# Patient Record
Sex: Male | Born: 1995 | Race: Black or African American | Hispanic: No | Marital: Single | Smoking: Current every day smoker
Health system: Southern US, Community
[De-identification: ages and names within clinical notes are randomized; demographics above are authoritative.]

## PROBLEM LIST (undated history)

## (undated) ENCOUNTER — Ambulatory Visit (HOSPITAL_COMMUNITY): Discharge status: Absent without leave | Payer: Self-pay

---
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
## 2016-03-19 ENCOUNTER — Encounter (HOSPITAL_COMMUNITY): Payer: Self-pay

## 2016-03-19 ENCOUNTER — Emergency Department (HOSPITAL_COMMUNITY)
Admission: EM | Admit: 2016-03-19 | Discharge: 2016-03-19 | Disposition: A | Discharge status: Home / Self Care | Payer: Worker's Compensation | Attending: Emergency Medicine | Admitting: Emergency Medicine

## 2016-03-19 DIAGNOSIS — Y939 Activity, unspecified: Secondary | ICD-10-CM | POA: Insufficient documentation

## 2016-03-19 DIAGNOSIS — Y929 Unspecified place or not applicable: Secondary | ICD-10-CM | POA: Insufficient documentation

## 2016-03-19 DIAGNOSIS — W268XXA Contact with other sharp object(s), not elsewhere classified, initial encounter: Secondary | ICD-10-CM | POA: Diagnosis not present

## 2016-03-19 DIAGNOSIS — S51811A Laceration without foreign body of right forearm, initial encounter: Secondary | ICD-10-CM | POA: Insufficient documentation

## 2016-03-19 DIAGNOSIS — Z23 Encounter for immunization: Secondary | ICD-10-CM | POA: Diagnosis not present

## 2016-03-19 DIAGNOSIS — Y990 Civilian activity done for income or pay: Secondary | ICD-10-CM | POA: Insufficient documentation

## 2016-03-19 DIAGNOSIS — IMO0002 Reserved for concepts with insufficient information to code with codable children: Secondary | ICD-10-CM

## 2016-03-19 MED ORDER — LIDOCAINE-EPINEPHRINE (PF) 2 %-1:200000 IJ SOLN
10.0000 mL | Freq: Once | INTRAMUSCULAR | Status: AC
  Administered 2016-03-19: 10 mL
  Filled 2016-03-19: qty 20

## 2016-03-19 MED ORDER — TETANUS-DIPHTH-ACELL PERTUSSIS 5-2.5-18.5 LF-MCG/0.5 IM SUSP
0.5000 mL | Freq: Once | INTRAMUSCULAR | Status: AC
  Administered 2016-03-19: 0.5 mL via INTRAMUSCULAR
  Filled 2016-03-19: qty 0.5

## 2016-03-19 NOTE — Discharge Instructions (Signed)
You will need to have your sutures removed in 7 days. You can have this done at urgent care or in this department or by your primary care provider. May wash wound with antibacterial soap and water. Otherwise keep clean and dry. Return to the ED if you experience redness or swelling around your wound, fevers or chills.

## 2016-03-19 NOTE — ED Triage Notes (Signed)
Pt states that he was at work and trying to piece off truck with another worker and cut his arm about 1 and a half inches. Bleeding controlled.

## 2016-03-19 NOTE — ED Provider Notes (Signed)
[facility]-EMERGENCY DEPT Provider Note   CSN: [id number] Arrival date & time: 03/19/16  0221     History   Chief Complaint Chief Complaint  Patient presents with  . Other    workers comp  . Laceration    HPI Jacob Hogan is a 20 y.o. male with no significant pmhx who presents to the ED today c/o laceration to right forearm. Pt states that he was at work unloading cardboard boxes for [employer] when a box fell on his right arm and caused a laceration. Laceration occurred around 5 hours PTA. Unknown last Tdap. No other trauma or injury.   HPI  History reviewed. No pertinent past medical history.  There are no active problems to display for this patient.   History reviewed. No pertinent surgical history.     Home Medications    Prior to Admission medications   Medication Sig Start Date End Date Taking? Authorizing Provider  amoxicillin (AMOXIL) 500 MG capsule Take 1 capsule (500 mg total) by mouth 3 (three) times daily. Patient not taking: Reported on 07/13/2014 02/20/14   Elpidio AnisShari Upstill, PA-C  clindamycin (CLEOCIN) 150 MG capsule Take 3 capsules (450 mg total) by mouth 3 (three) times daily. 01/16/15   Antony MaduraKelly Humes, PA-C  ibuprofen (ADVIL,MOTRIN) 600 MG tablet Take 1 tablet (600 mg total) by mouth every 6 (six) hours as needed. 02/20/14   Elpidio AnisShari Upstill, PA-C  ketorolac (TORADOL) 10 MG tablet Take 1 tablet (10 mg total) by mouth every 6 (six) hours as needed. 01/16/15   Antony MaduraKelly Humes, PA-C    Family History No family history on file.  Social History Social History  Substance Use Topics  . Smoking status: Never Smoker  . Smokeless tobacco: Never Used  . Alcohol use No     Allergies   Penicillins   Review of Systems Review of Systems  All other systems reviewed and are negative.    Physical Exam Updated Vital Signs BP 125/71   Pulse 94   Temp 98.8 F (37.1 C)   Resp 18   Ht 5\' 4"  (1.626 m)   Wt 76.2 kg   SpO2 100%   BMI 28.84 kg/m   Physical Exam    Constitutional: He is oriented to person, place, and time. He appears well-developed and well-nourished. No distress.  HENT:  Head: Normocephalic and atraumatic.  Eyes: Conjunctivae are normal. Right eye exhibits no discharge. Left eye exhibits no discharge. No scleral icterus.  Cardiovascular: Normal rate.   Pulmonary/Chest: Effort normal.  Neurological: He is alert and oriented to person, place, and time. Coordination normal.  Skin: Skin is warm and dry. No rash noted. He is not diaphoretic. No erythema. No pallor.  3 cm laceration to the volar aspect of right forearm. No foreign body seen or palpated. No surrounding erythema or edema.  Psychiatric: He has a normal mood and affect. His behavior is normal.  Nursing note and vitals reviewed.    ED Treatments / Results  Labs (all labs ordered are listed, but only abnormal results are displayed) Labs Reviewed - No data to display  EKG  EKG Interpretation None       Radiology No results found.  Procedures Procedures (including critical care time)  LACERATION REPAIR Performed by: Dub MikesSamantha Tripp Dowless Authorized by: Dub MikesSamantha Tripp Dowless Consent: Verbal consent obtained. Risks and benefits: risks, benefits and alternatives were discussed Consent given by: patient Patient identity confirmed: provided demographic data Prepped and Draped in normal sterile fashion Wound explored  Laceration Location: Right  forearm  Laceration Length: 3 cm  No Foreign Bodies seen or palpated  Anesthesia: local infiltration  Local anesthetic: lidocaine 2 % with epinephrine  Anesthetic total: 2 ml  Irrigation method: syringe Amount of cleaning: standard  Skin closure: Approximated   Number of sutures: 3   Technique: Simple interrupted   Patient tolerance: Patient tolerated the procedure well with no immediate complications.   Medications Ordered in ED Medications  Tdap (BOOSTRIX) injection 0.5 mL (not administered)   lidocaine-EPINEPHrine (XYLOCAINE W/EPI) 2 %-1:200000 (PF) injection 10 mL (not administered)     Initial Impression / Assessment and Plan / ED Course  I have reviewed the triage vital signs and the nursing notes.  Pertinent labs & imaging results that were available during my care of the patient were reviewed by me and considered in my medical decision making (see chart for details).  Clinical Course    Tdap booster given.Pressure irrigation performed. Laceration occurred < 8 hours prior to repair which was well tolerated. Pt has no co morbidities to effect normal wound healing. Discussed suture home care w pt and answered questions. Pt to f-u for wound check and suture removal in 7 days. Pt is hemodynamically stable w no complaints prior to dc.     Final Clinical Impressions(s) / ED Diagnoses   Final diagnoses:  Laceration    New Prescriptions New Prescriptions   No medications on file     Dub MikesSamantha Tripp Dowless, PA-C 03/19/16 [id number]    April Palumbo, MD 03/19/16 2307

## 2017-04-06 ENCOUNTER — Emergency Department (HOSPITAL_COMMUNITY)
Admission: EM | Admit: 2017-04-06 | Discharge: 2017-04-06 | Disposition: A | Discharge status: Home / Self Care | Payer: Self-pay | Attending: Emergency Medicine | Admitting: Emergency Medicine

## 2017-04-06 ENCOUNTER — Encounter (HOSPITAL_COMMUNITY): Payer: Self-pay | Admitting: Emergency Medicine

## 2017-04-06 ENCOUNTER — Emergency Department (HOSPITAL_COMMUNITY): Payer: Self-pay

## 2017-04-06 DIAGNOSIS — R1012 Left upper quadrant pain: Secondary | ICD-10-CM | POA: Insufficient documentation

## 2017-04-06 DIAGNOSIS — F17210 Nicotine dependence, cigarettes, uncomplicated: Secondary | ICD-10-CM | POA: Insufficient documentation

## 2017-04-06 LAB — CBC
HEMATOCRIT: 43 % (ref 39.0–52.0)
HEMOGLOBIN: 14.4 g/dL (ref 13.0–17.0)
MCH: 29.5 pg (ref 26.0–34.0)
MCHC: 33.5 g/dL (ref 30.0–36.0)
MCV: 88.1 fL (ref 78.0–100.0)
Platelets: 238 10*3/uL (ref 150–400)
RBC: 4.88 MIL/uL (ref 4.22–5.81)
RDW: 13.4 % (ref 11.5–15.5)
WBC: 10.5 10*3/uL (ref 4.0–10.5)

## 2017-04-06 LAB — COMPREHENSIVE METABOLIC PANEL
ALK PHOS: 54 U/L (ref 38–126)
ALT: 15 U/L — ABNORMAL LOW (ref 17–63)
ANION GAP: 8 (ref 5–15)
AST: 24 U/L (ref 15–41)
Albumin: 4.4 g/dL (ref 3.5–5.0)
BILIRUBIN TOTAL: 0.6 mg/dL (ref 0.3–1.2)
BUN: 6 mg/dL (ref 6–20)
CO2: 30 mmol/L (ref 22–32)
Calcium: 9.9 mg/dL (ref 8.9–10.3)
Chloride: 101 mmol/L (ref 101–111)
Creatinine, Ser: 1.15 mg/dL (ref 0.61–1.24)
GFR calc Af Amer: >60 mL/min (ref >60)
Glucose, Bld: 115 mg/dL — ABNORMAL HIGH (ref 65–99)
POTASSIUM: 4.2 mmol/L (ref 3.5–5.1)
Sodium: 139 mmol/L (ref 135–145)
TOTAL PROTEIN: 7.6 g/dL (ref 6.5–8.1)

## 2017-04-06 LAB — URINALYSIS, ROUTINE W REFLEX MICROSCOPIC
Bilirubin Urine: NEGATIVE
Glucose, UA: NEGATIVE mg/dL
Hgb urine dipstick: NEGATIVE
KETONES UR: NEGATIVE mg/dL
LEUKOCYTES UA: NEGATIVE
Nitrite: NEGATIVE
Protein, ur: NEGATIVE mg/dL
SPECIFIC GRAVITY, URINE: 1.014 (ref 1.005–1.030)
pH: 6 (ref 5.0–8.0)

## 2017-04-06 LAB — LIPASE, BLOOD: Lipase: 35 U/L (ref 11–51)

## 2017-04-06 LAB — I-STAT TROPONIN, ED: Troponin i, poc: 0.01 ng/mL (ref 0.00–0.08)

## 2017-04-06 MED ORDER — OMEPRAZOLE 20 MG PO CPDR: 20.0000 mg | DELAYED_RELEASE_CAPSULE | Freq: Every day | ORAL | 0 refills | Status: DC

## 2017-04-06 NOTE — Discharge Instructions (Signed)
As discussed, your evaluation today has been largely reassuring.  But, it is important that you monitor your condition carefully, and do not hesitate to return to the ED if you develop new, or concerning changes in your condition. ? ?Otherwise, please follow-up with your physician for appropriate ongoing care. ? ?

## 2017-04-06 NOTE — ED Provider Notes (Signed)
[facility] Provider Note   CSN: [id number] Arrival date & time: 04/06/17  0124     History   Chief Complaint Chief Complaint  Patient presents with  . Abdominal Pain    epigastric    HPI Jacob Hogan is a 21 y.o. male.  HPI  Patient presents with concern of episodic left upper quadrant abdominal pain. He notes that over the past 2 months he has had transient episodes, occurring without clear precipitant, and either resolving spontaneously or after he drank something. Last night, approximately 8 hours ago the patient had an episode of similar pain, burning, sharp, moderate in the left upper quadrant. The pain is nonradiating. Pain resolved after 45 minutes without any intervention. Since resolution he has had no additional complaints. Patient states that he is generally well, does smoke cigarettes, does not drink alcohol regularly.   History reviewed. No pertinent past medical history.  There are no active problems to display for this patient.   History reviewed. No pertinent surgical history.     Home Medications    Prior to Admission medications   Medication Sig Start Date End Date Taking? Authorizing Provider  omeprazole (PRILOSEC) 20 MG capsule Take 1 capsule (20 mg total) by mouth daily. Take one tablet daily 04/06/17   Gerhard Munch, MD    Family History No family history on file.  Social History Social History  Substance Use Topics  . Smoking status: Current Every Day Smoker    Packs/day: 0.25    Types: Cigarettes  . Smokeless tobacco: Never Used  . Alcohol use Yes     Comment: occ     Allergies   Penicillins   Review of Systems Review of Systems  Constitutional:       Per HPI, otherwise negative  HENT:       Per HPI, otherwise negative  Respiratory:       Per HPI, otherwise negative  Cardiovascular:       Per HPI, otherwise negative  Gastrointestinal: Negative for vomiting.  Endocrine:       Negative aside from HPI    Genitourinary:       Neg aside from HPI   Musculoskeletal:       Per HPI, otherwise negative  Skin: Negative.   Neurological: Negative for syncope.     Physical Exam Updated Vital Signs BP (!) 97/55   Pulse 67   Temp 97.6 F (36.4 C)   Resp 16   SpO2 99%   Physical Exam  Constitutional: He is oriented to person, place, and time. He appears well-developed. No distress.  HENT:  Head: Normocephalic and atraumatic.  Eyes: Conjunctivae and EOM are normal.  Cardiovascular: Normal rate and regular rhythm.   Pulmonary/Chest: Effort normal. No stridor. No respiratory distress.  Abdominal: He exhibits no distension.    Musculoskeletal: He exhibits no edema.  Neurological: He is alert and oriented to person, place, and time.  Skin: Skin is warm and dry.  Psychiatric: He has a normal mood and affect.  Nursing note and vitals reviewed.    ED Treatments / Results  Labs (all labs ordered are listed, but only abnormal results are displayed) Labs Reviewed  COMPREHENSIVE METABOLIC PANEL - Abnormal; Notable for the following:       Result Value   Glucose, Bld 115 (*)    ALT 15 (*)    All other components within normal limits  LIPASE, BLOOD  CBC  URINALYSIS, ROUTINE W REFLEX MICROSCOPIC  I-STAT TROPONIN, ED  EKG  EKG Interpretation  Date/Time:  Wednesday April 06 2017 01:34:21 EDT Ventricular Rate:  79 PR Interval:  140 QRS Duration: 76 QT Interval:  346 QTC Calculation: 396 R Axis:   34 Text Interpretation:  Normal sinus rhythm with sinus arrhythmia Artifact Borderline ECG Confirmed by Gerhard MunchLockwood, Jaye Polidori [phone]([id number]) on 04/06/2017 5:34:00 AM       Radiology Dg Chest 2 View  Result Date: 04/06/2017 CLINICAL DATA:  Upper abdominal pain for 1 day. EXAM: CHEST  2 VIEW COMPARISON:  None. FINDINGS: The cardiomediastinal contours are normal. The lungs are clear. Pulmonary vasculature is normal. No consolidation, pleural effusion, or pneumothorax. No acute osseous abnormalities  are seen. IMPRESSION: Normal radiographs of the chest. Electronically Signed   By: Rubye OaksMelanie  Ehinger M.D.   On: 04/06/2017 02:28    Procedures Procedures (including critical care time)  Medications Ordered in ED Medications - No data to display   Initial Impression / Assessment and Plan / ED Course  I have reviewed the triage vital signs and the nursing notes.  Pertinent labs & imaging results that were available during my care of the patient were reviewed by me and considered in my medical decision making (see chart for details).  Generally well-appearing young male presents with episodic left upper quadrant abdominal pain. Here he is awake, alert, with no ongoing symptoms. Patient's story suggests gastric etiology given the otherwise reassuring findings, absence of risk factors for other substantial processes including coronary disease. With no ongoing symptoms, and his description of pain, patient was started on a course of PPI. Patient will follow-up with gastroenterology.  Final Clinical Impressions(s) / ED Diagnoses   Final diagnoses:  Left upper quadrant pain    New Prescriptions New Prescriptions   OMEPRAZOLE (PRILOSEC) 20 MG CAPSULE    Take 1 capsule (20 mg total) by mouth daily. Take one tablet daily     Gerhard MunchLockwood, Marlane Hirschmann, MD 04/06/17 [phone]

## 2017-04-06 NOTE — ED Triage Notes (Signed)
Pt to ED c/o epigastric and LUQ abd pain x 2 months, intermittent. Patient states this episode started about 1.5 hours ago, was very intense at first and has calmed down some, but he wants to know what it is. Pt states, "it feels like something is trying to crawl its way out of my stomach and is pulling my stomach in." Pt denies N/V/D, no fevers or chills. Resp e/u, skin warm/dry.

## 2018-02-06 ENCOUNTER — Emergency Department (HOSPITAL_COMMUNITY)
Admission: EM | Admit: 2018-02-06 | Discharge: 2018-02-06 | Disposition: A | Discharge status: Home / Self Care | Payer: Self-pay | Attending: Emergency Medicine | Admitting: Emergency Medicine

## 2018-02-06 ENCOUNTER — Encounter (HOSPITAL_COMMUNITY): Payer: Self-pay | Admitting: Emergency Medicine

## 2018-02-06 ENCOUNTER — Emergency Department (HOSPITAL_COMMUNITY): Payer: Self-pay

## 2018-02-06 ENCOUNTER — Other Ambulatory Visit: Payer: Self-pay

## 2018-02-06 DIAGNOSIS — Y9372 Activity, wrestling: Secondary | ICD-10-CM | POA: Insufficient documentation

## 2018-02-06 DIAGNOSIS — S0990XA Unspecified injury of head, initial encounter: Secondary | ICD-10-CM

## 2018-02-06 DIAGNOSIS — F17210 Nicotine dependence, cigarettes, uncomplicated: Secondary | ICD-10-CM | POA: Insufficient documentation

## 2018-02-06 DIAGNOSIS — S161XXA Strain of muscle, fascia and tendon at neck level, initial encounter: Secondary | ICD-10-CM | POA: Insufficient documentation

## 2018-02-06 DIAGNOSIS — W51XXXA Accidental striking against or bumped into by another person, initial encounter: Secondary | ICD-10-CM | POA: Insufficient documentation

## 2018-02-06 DIAGNOSIS — Y999 Unspecified external cause status: Secondary | ICD-10-CM | POA: Insufficient documentation

## 2018-02-06 DIAGNOSIS — Y929 Unspecified place or not applicable: Secondary | ICD-10-CM | POA: Insufficient documentation

## 2018-02-06 DIAGNOSIS — S0101XA Laceration without foreign body of scalp, initial encounter: Secondary | ICD-10-CM | POA: Insufficient documentation

## 2018-02-06 MED ORDER — IBUPROFEN 800 MG PO TABS: 800.0000 mg | ORAL_TABLET | Freq: Three times a day (TID) | ORAL | 0 refills | Status: DC | PRN

## 2018-02-06 MED ORDER — FENTANYL CITRATE (PF) 100 MCG/2ML IJ SOLN
INTRAMUSCULAR | Status: AC
  Administered 2018-02-06: 100 ug
  Filled 2018-02-06: qty 2

## 2018-02-06 MED ORDER — TETANUS-DIPHTH-ACELL PERTUSSIS 5-2.5-18.5 LF-MCG/0.5 IM SUSP
0.5000 mL | Freq: Once | INTRAMUSCULAR | Status: DC
  Filled 2018-02-06: qty 0.5

## 2018-02-06 MED ORDER — LIDOCAINE-EPINEPHRINE 2 %-1:200000 IJ SOLN
10.0000 mL | Freq: Once | INTRAMUSCULAR | Status: AC
Start: 2018-02-06 — End: 2018-02-06
  Administered 2018-02-06: 10 mL
  Filled 2018-02-06: qty 20

## 2018-02-06 NOTE — ED Notes (Signed)
Patient transported to CT 

## 2018-02-06 NOTE — ED Notes (Signed)
Patient Alert and oriented to baseline. Stable and ambulatory to baseline. Patient verbalized understanding of the discharge instructions.  Patient belongings were taken by the patient.   
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
## 2018-02-06 NOTE — ED Notes (Signed)
ED Provider at bedside. 

## 2018-02-06 NOTE — ED Notes (Signed)
Patient returned from CT
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
## 2018-02-06 NOTE — Discharge Instructions (Signed)

## 2018-02-14 ENCOUNTER — Ambulatory Visit (HOSPITAL_COMMUNITY)
Admission: EM | Admit: 2018-02-14 | Discharge: 2018-02-14 | Disposition: A | Discharge status: Home / Self Care | Payer: Self-pay

## 2018-02-14 DIAGNOSIS — S0990XA Unspecified injury of head, initial encounter: Secondary | ICD-10-CM

## 2018-02-14 DIAGNOSIS — R51 Headache: Secondary | ICD-10-CM

## 2018-02-14 DIAGNOSIS — Z4802 Encounter for removal of sutures: Secondary | ICD-10-CM

## 2018-02-14 NOTE — ED Triage Notes (Signed)
Pt here for suture removal to posterior head.  Wound is clean and dry and approximated.  5 sutures were removed and pt tolerated it well.  Pt is still having some headache pain and was encouraged to limit his screen time and continue taking his Ibuprofen as prescribed.  Pt stated understanding.

## 2018-02-14 NOTE — ED Notes (Signed)
Bed: UC01 Expected date:  Expected time:  Means of arrival:  Comments: Appointments 

## 2020-03-07 ENCOUNTER — Encounter (HOSPITAL_COMMUNITY): Payer: Self-pay

## 2020-03-07 ENCOUNTER — Ambulatory Visit (HOSPITAL_COMMUNITY)
Admission: EM | Admit: 2020-03-07 | Discharge: 2020-03-07 | Disposition: A | Discharge status: Home / Self Care | Payer: Self-pay | Attending: Family Medicine | Admitting: Family Medicine

## 2020-03-07 ENCOUNTER — Other Ambulatory Visit: Payer: Self-pay

## 2020-03-07 ENCOUNTER — Ambulatory Visit (INDEPENDENT_AMBULATORY_CARE_PROVIDER_SITE_OTHER): Payer: Self-pay

## 2020-03-07 DIAGNOSIS — M79642 Pain in left hand: Secondary | ICD-10-CM

## 2020-03-07 DIAGNOSIS — S62339A Displaced fracture of neck of unspecified metacarpal bone, initial encounter for closed fracture: Secondary | ICD-10-CM

## 2020-03-07 MED ORDER — HYDROCODONE-ACETAMINOPHEN 5-325 MG PO TABS: 1.0000 | ORAL_TABLET | Freq: Four times a day (QID) | ORAL | 0 refills | Status: DC | PRN

## 2020-03-07 NOTE — Progress Notes (Signed)
Orthopedic Tech Progress Note Patient Details:  Jacob Hogan 04-Oct-1995 [id number]  Ortho Devices Type of Ortho Device: Ulna gutter splint Ortho Device/Splint Location: URE Ortho Device/Splint Interventions: Ordered, Application   Post Interventions Patient Tolerated: Well   [name] [name] 03/07/2020, 7:23 PM
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
## 2020-03-07 NOTE — Discharge Instructions (Addendum)
  No use of hand  leave splint on Use ice and elevation to reduce reduce swelling Take ibuprofen for moderate pain Take pain medication as needed for severe pain.  Do not drive on pain medicine Call Dr. Donnie Mesa office Monday morning for an appointment next week
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
## 2020-04-02 ENCOUNTER — Ambulatory Visit (HOSPITAL_COMMUNITY)
Admission: EM | Admit: 2020-04-02 | Discharge: 2020-04-02 | Disposition: A | Discharge status: Home / Self Care | Payer: HRSA Program | Attending: Family Medicine | Admitting: Family Medicine

## 2020-04-02 ENCOUNTER — Other Ambulatory Visit: Payer: Self-pay

## 2020-04-02 DIAGNOSIS — Z1152 Encounter for screening for COVID-19: Secondary | ICD-10-CM

## 2020-04-02 DIAGNOSIS — Z20822 Contact with and (suspected) exposure to covid-19: Secondary | ICD-10-CM | POA: Diagnosis not present

## 2020-04-02 IMAGING — CT CT CERVICAL SPINE W/O CM
5 of 8 series · 12 of 33 positions shown, 13 images · non-contrast
Comparison: None.

CLINICAL DATA: 21-year-old male with history of trauma from injury
to head on kitchen floor. Laceration in the acceptable region of the
head.

EXAM:
CT HEAD WITHOUT CONTRAST
CT CERVICAL SPINE WITHOUT CONTRAST
TECHNIQUE: Multidetector CT imaging of the head and cervical spine was
performed following the standard protocol without intravenous
contrast. Multiplanar CT image reconstructions of the cervical spine
were also generated.

[Series 5: head bone · axial · 0.46mm/px · z∈[+1172,+1228]mm · 2 of 85 slices shown]
[im 29/85  bone]
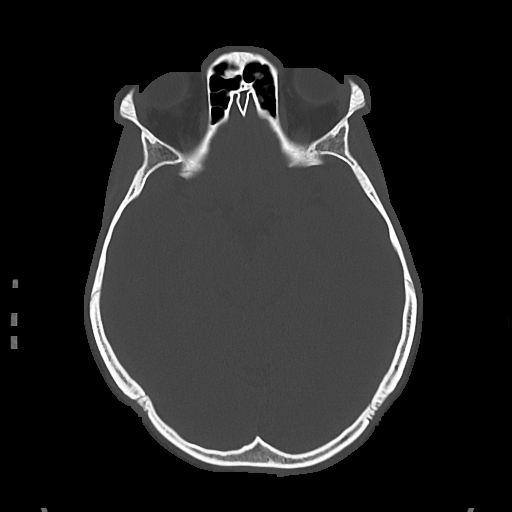
[im 57/85  bone]
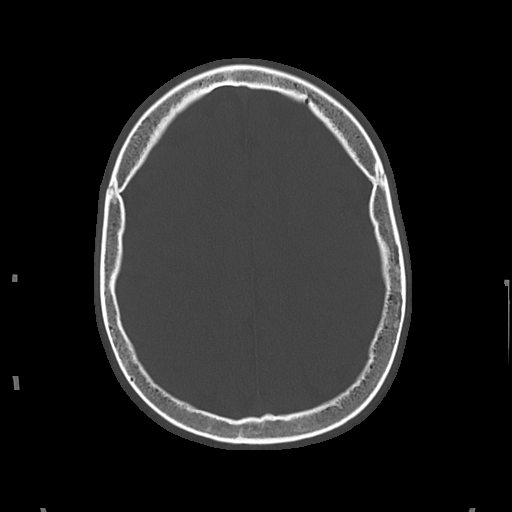

[Series 9: c spine soft · axial · 0.27mm/px · z∈[+1047,+1103]mm · 2 of 86 slices shown]
[im 29/86  soft-tissue]
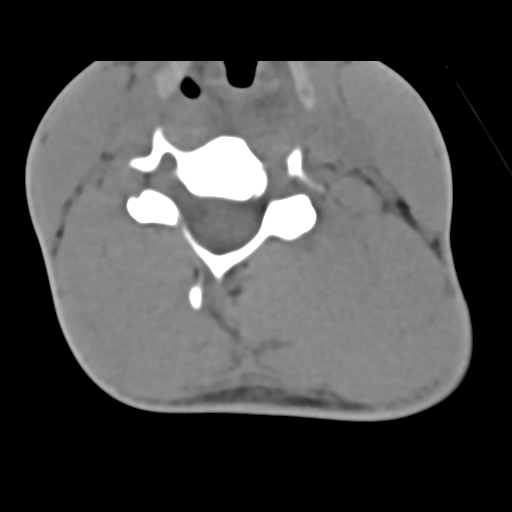
[im 57/86  soft-tissue]
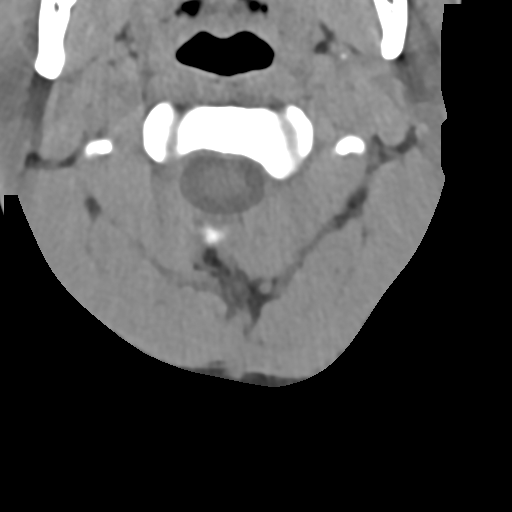

[Series 10: orthogonal axials · axial · 0.21mm/px · z∈[+1022,+1080]mm · 2 of 80 slices shown, 3 images]
[im 27/80  soft-tissue]
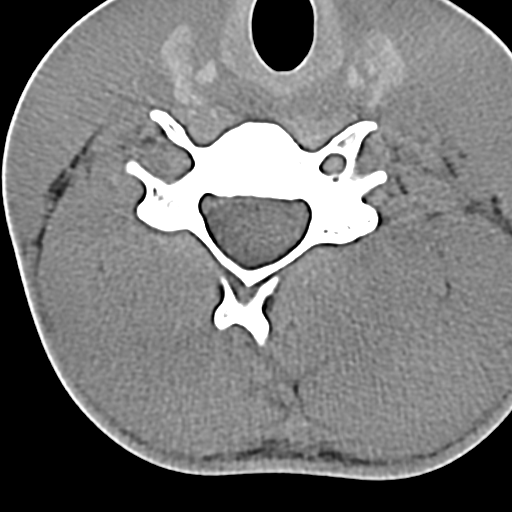
[im 27/80  bone]
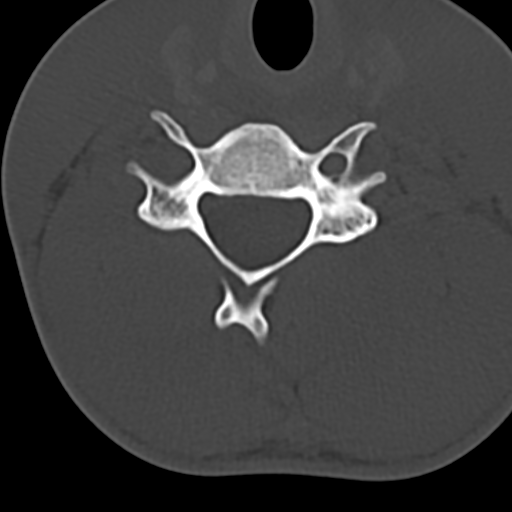
[im 53/80  bone]
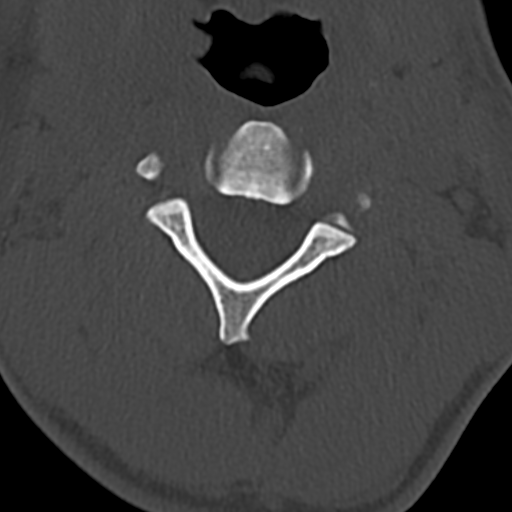

[Series 11: sag bone · sagittal · 0.27mm/px · 5 of 61 slices shown]
[im 11/61  bone]
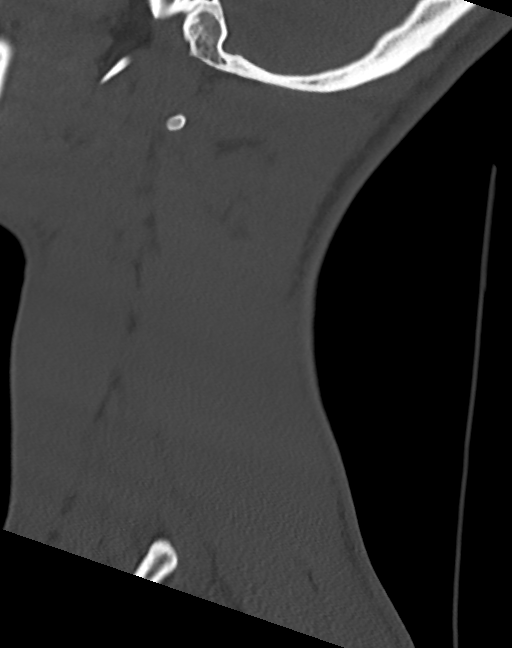
[im 21/61  bone]
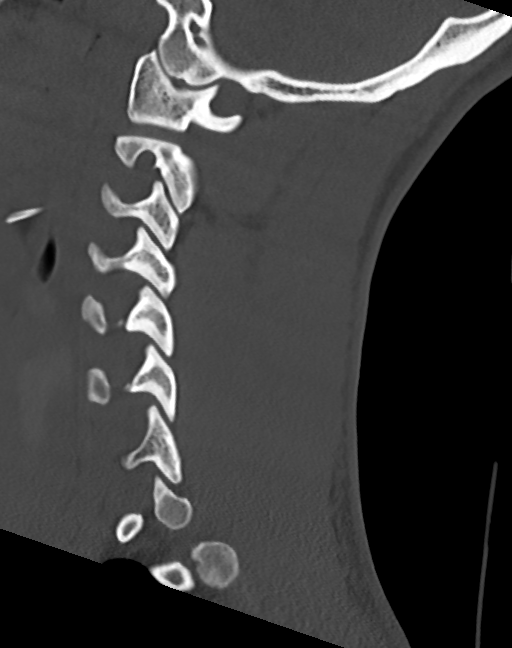
[im 31/61  bone]
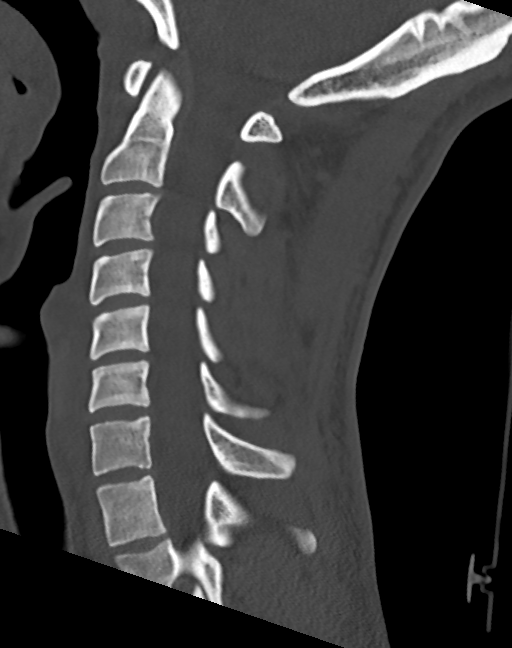
[im 41/61  bone]
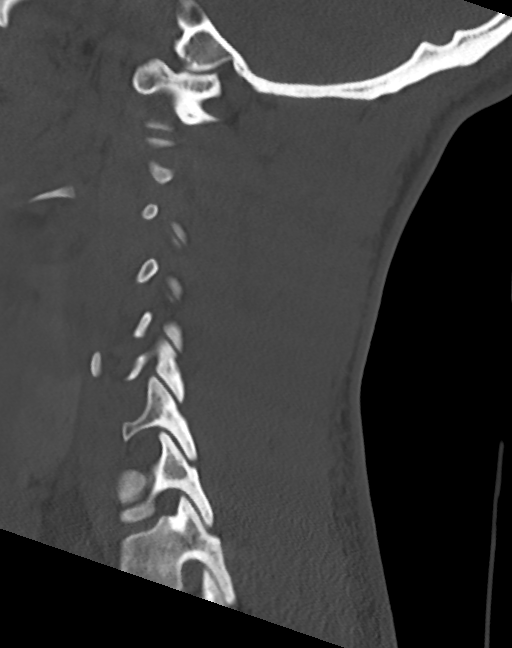
[im 51/61  bone]
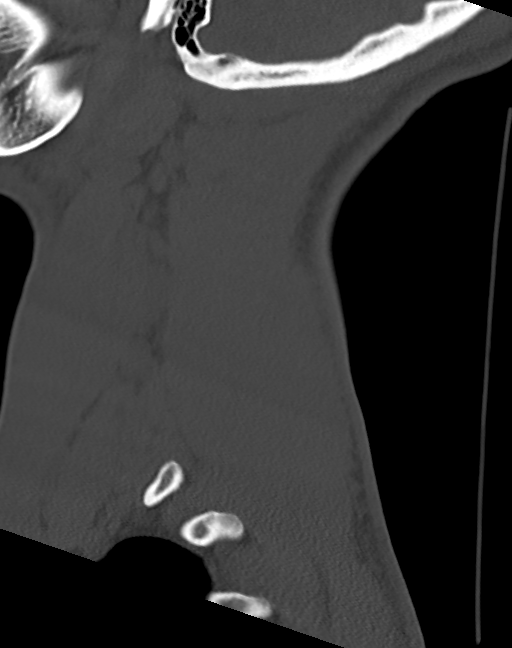

[Series 12: cor bone · coronal · 0.23mm/px · 1 of 69 slices shown]
[im 35/69  bone]
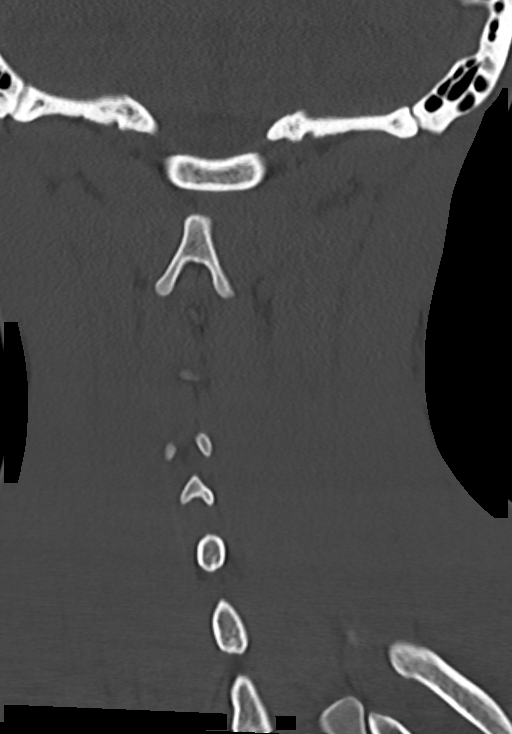

[12 of 33 positions shown; findings below may reference images not displayed]

FINDINGS: CT HEAD FINDINGS

Brain: No evidence of acute infarction, hemorrhage, hydrocephalus,
extra-axial collection or mass lesion/mass effect.

Vascular: No hyperdense vessel or unexpected calcification.

Skull: Normal. Negative for fracture or focal lesion.

Sinuses/Orbits: Small mucosal retention cyst or polyp in the right
maxillary sinus incidentally noted. No acute finding.

Other: None.

CT CERVICAL SPINE FINDINGS

Alignment: Normal.

Skull base and vertebrae: No acute fracture. No primary bone lesion
or focal pathologic process.

Soft tissues and spinal canal: No prevertebral fluid or swelling. No
visible canal hematoma.

Disc levels:  No significant degenerative disc disease.

Upper chest: Unremarkable.

Other: None.
IMPRESSION: 1. No evidence of significant acute traumatic injury to the skull,
brain or cervical spine.
2. The appearance of the brain is normal.

## 2020-04-02 NOTE — Discharge Instructions (Signed)

## 2020-04-02 NOTE — ED Triage Notes (Signed)
Pt presents for COVID test after being exposed two days ago. Denies any symptoms at this time.

## 2020-04-03 LAB — SARS CORONAVIRUS 2 (TAT 6-24 HRS): SARS Coronavirus 2: NEGATIVE

## 2020-11-23 ENCOUNTER — Emergency Department (HOSPITAL_COMMUNITY)
Admission: EM | Admit: 2020-11-23 | Discharge: 2020-11-24 | Disposition: A | Discharge status: Home / Self Care | Payer: BC Managed Care – PPO | Attending: Emergency Medicine | Admitting: Emergency Medicine

## 2020-11-23 DIAGNOSIS — R609 Edema, unspecified: Secondary | ICD-10-CM

## 2020-11-23 DIAGNOSIS — M7989 Other specified soft tissue disorders: Secondary | ICD-10-CM | POA: Diagnosis present

## 2020-11-23 DIAGNOSIS — F17210 Nicotine dependence, cigarettes, uncomplicated: Secondary | ICD-10-CM | POA: Diagnosis not present

## 2020-11-23 DIAGNOSIS — R600 Localized edema: Secondary | ICD-10-CM | POA: Diagnosis not present

## 2020-11-24 ENCOUNTER — Encounter (HOSPITAL_COMMUNITY): Payer: Self-pay | Admitting: Emergency Medicine

## 2020-11-24 ENCOUNTER — Other Ambulatory Visit: Payer: Self-pay

## 2020-11-24 LAB — I-STAT CHEM 8, ED
BUN: 32 mg/dL — ABNORMAL HIGH (ref 6–20)
Calcium, Ion: 1.14 mmol/L — ABNORMAL LOW (ref 1.15–1.40)
Chloride: 101 mmol/L (ref 98–111)
Creatinine, Ser: 1.3 mg/dL — ABNORMAL HIGH (ref 0.61–1.24)
Glucose, Bld: 131 mg/dL — ABNORMAL HIGH (ref 70–99)
HCT: 40 % (ref 39.0–52.0)
Hemoglobin: 13.6 g/dL (ref 13.0–17.0)
Potassium: 4 mmol/L (ref 3.5–5.1)
Sodium: 140 mmol/L (ref 135–145)
TCO2: 28 mmol/L (ref 22–32)

## 2020-11-24 NOTE — ED Provider Notes (Signed)
[facility]-EMERGENCY DEPT Provider Note   CSN: [id number] Arrival date & time: 11/23/20  2340     History Chief Complaint  Patient presents with  . Foot Swelling    Amalio Loe is a 25 y.o. male.  The history is provided by the patient.  Illness Location:  Feet  Quality:  Swellling Severity:  Mild Onset quality:  Gradual Timing:  Constant Progression:  Unchanged Chronicity:  New Context:  Walking a lot in crocs Relieved by:  Nothing  Worsened by:  Nothing  Ineffective treatments:  Crocs  Associated symptoms: no abdominal pain, no chest pain, no congestion, no cough, no diarrhea, no ear pain, no fatigue, no fever, no headaches, no loss of consciousness, no myalgias, no nausea, no rash, no rhinorrhea, no shortness of breath, no sore throat, no vomiting and no wheezing        History reviewed. No pertinent past medical history.  There are no problems to display for this patient.   History reviewed. No pertinent surgical history.     Family History  Problem Relation Age of Onset  . Healthy Mother   . Healthy Father     Social History   Tobacco Use  . Smoking status: Current Every Day Smoker    Packs/day: 0.25    Types: Cigarettes  . Smokeless tobacco: Never Used  Substance Use Topics  . Alcohol use: Yes    Comment: occ  . Drug use: Yes    Types: Marijuana    Home Medications Prior to Admission medications   Medication Sig Start Date End Date Taking? Authorizing Provider  HYDROcodone-acetaminophen (NORCO/VICODIN) 5-325 MG tablet Take 1-2 tablets by mouth every 6 (six) hours as needed. Patient not taking: Reported on 11/24/2020 03/07/20   Eustace Moore, MD  ibuprofen (ADVIL,MOTRIN) 800 MG tablet Take 1 tablet (800 mg total) by mouth every 8 (eight) hours as needed. Patient not taking: No sig reported 02/06/18   Long, Arlyss Repress, MD  omeprazole (PRILOSEC) 20 MG capsule Take 1 capsule (20 mg total) by mouth daily. Take one tablet  daily Patient not taking: No sig reported 04/06/17   Gerhard Munch, MD    Allergies    Penicillins and Apple  Review of Systems   Review of Systems  Constitutional: Negative for fatigue and fever.  HENT: Negative for congestion, ear pain, rhinorrhea and sore throat.   Eyes: Negative for visual disturbance.  Respiratory: Negative for cough, shortness of breath and wheezing.   Cardiovascular: Negative for chest pain.  Gastrointestinal: Negative for abdominal pain, diarrhea, nausea and vomiting.  Genitourinary: Negative for difficulty urinating.  Musculoskeletal: Negative for myalgias.  Skin: Negative for rash.  Neurological: Negative for loss of consciousness and headaches.  Psychiatric/Behavioral: Negative for agitation.  All other systems reviewed and are negative.   Physical Exam Updated Vital Signs BP 107/65   Pulse 67   Temp 98.8 F (37.1 C) (Oral)   Resp 18   SpO2 98%   Physical Exam Vitals and nursing note reviewed.  Constitutional:      General: He is not in acute distress.    Appearance: Normal appearance.  HENT:     Head: Normocephalic and atraumatic.     Nose: Nose normal.  Eyes:     Conjunctiva/sclera: Conjunctivae normal.     Pupils: Pupils are equal, round, and reactive to light.  Cardiovascular:     Rate and Rhythm: Normal rate and regular rhythm.     Pulses: Normal pulses.  Heart sounds: Normal heart sounds.  Pulmonary:     Effort: Pulmonary effort is normal.     Breath sounds: Normal breath sounds.  Abdominal:     General: Abdomen is flat. Bowel sounds are normal.     Palpations: Abdomen is soft.     Tenderness: There is no abdominal tenderness. There is no guarding.  Musculoskeletal:        General: Normal range of motion.     Cervical back: Normal range of motion and neck supple.     Comments: Trace non pitting edema of  The feet   Skin:    General: Skin is warm and dry.     Capillary Refill: Capillary refill takes less than 2 seconds.   Neurological:     General: No focal deficit present.     Mental Status: He is alert and oriented to person, place, and time.     Deep Tendon Reflexes: Reflexes normal.  Psychiatric:        Mood and Affect: Mood normal.        Behavior: Behavior normal.     ED Results / Procedures / Treatments   Labs (all labs ordered are listed, but only abnormal results are displayed) Labs Reviewed - No data to display  EKG None  Radiology No results found.  Procedures Procedures   Medications Ordered in ED Medications - No data to display  ED Course  I have reviewed the triage vital signs and the nursing notes.  Pertinent labs & imaging results that were available during my care of the patient were reviewed by me and considered in my medical decision making (see chart for details).   Dependent edema.  No croc wearing ice and elevate feet and wear compression stockings and lace up shoes.     Unknown Schleyer was evaluated in Emergency Department on 11/24/2020 for the symptoms described in the history of present illness. He was evaluated in the context of the global COVID-19 pandemic, which necessitated consideration that the patient might be at risk for infection with the SARS-CoV-2 virus that causes COVID-19. Institutional protocols and algorithms that pertain to the evaluation of patients at risk for COVID-19 are in a state of rapid change based on information released by regulatory bodies including the CDC and federal and state organizations. These policies and algorithms were followed during the patient's care in the ED.  Final Clinical Impression(s) / ED Diagnoses Final diagnoses:  Dependent edema   Return for intractable cough, coughing up blood, fevers >100.4 unrelieved by medication, shortness of breath, intractable vomiting, chest pain, shortness of breath, weakness, numbness, changes in speech, facial asymmetry, abdominal pain, passing out, Inability to tolerate liquids or food,  cough, altered mental status or any concerns. No signs of systemic illness or infection. The patient is nontoxic-appearing on exam and vital signs are within normal limits.  I have reviewed the triage vital signs and the nursing notes. Pertinent labs & imaging results that were available during my care of the patient were reviewed by me and considered in my medical decision making (see chart for details). After history, exam, and medical workup I feel the patient has been appropriately medically screened and is safe for discharge home. Pertinent diagnoses were discussed with the patient. Patient was given return precautions. Rx / DC Orders ED Discharge Orders         Ordered    Compression stockings       Comments: Use as directed   11/24/20 0135  Mckinze Poirier, MD 11/24/20 2481

## 2020-11-24 NOTE — ED Triage Notes (Addendum)
Patient arrives complaining of bilateral feet swelling and a sudden gain in weight over the last 2-3 weeks. Patient states that he has gained about 12-15 pounds. Patient noted to have pitting edema to his feet. Patient states he works from home and sits for long periods of time. Patient also requesting evaluation for foreign body removal from his left leg.

## 2022-05-02 IMAGING — DX DG HAND COMPLETE 3+V*L*
3 series · 3 of 3 positions shown · non-contrast
Comparison: None.

CLINICAL DATA: Pain

EXAM:
LEFT HAND - COMPLETE 3+ VIEW

[hand pa]
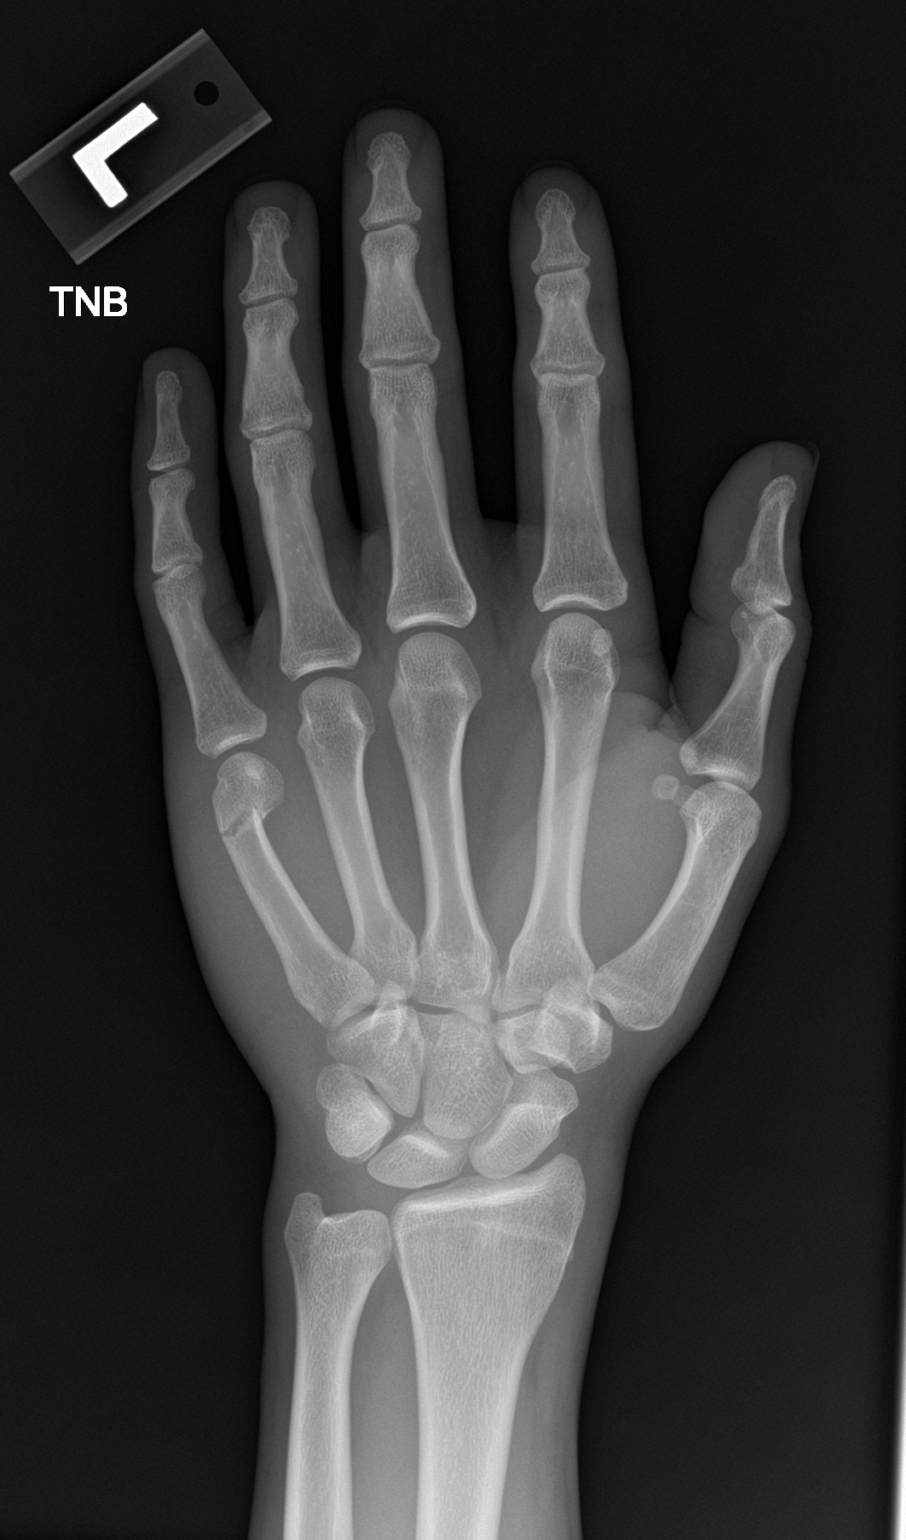

[hand obl]
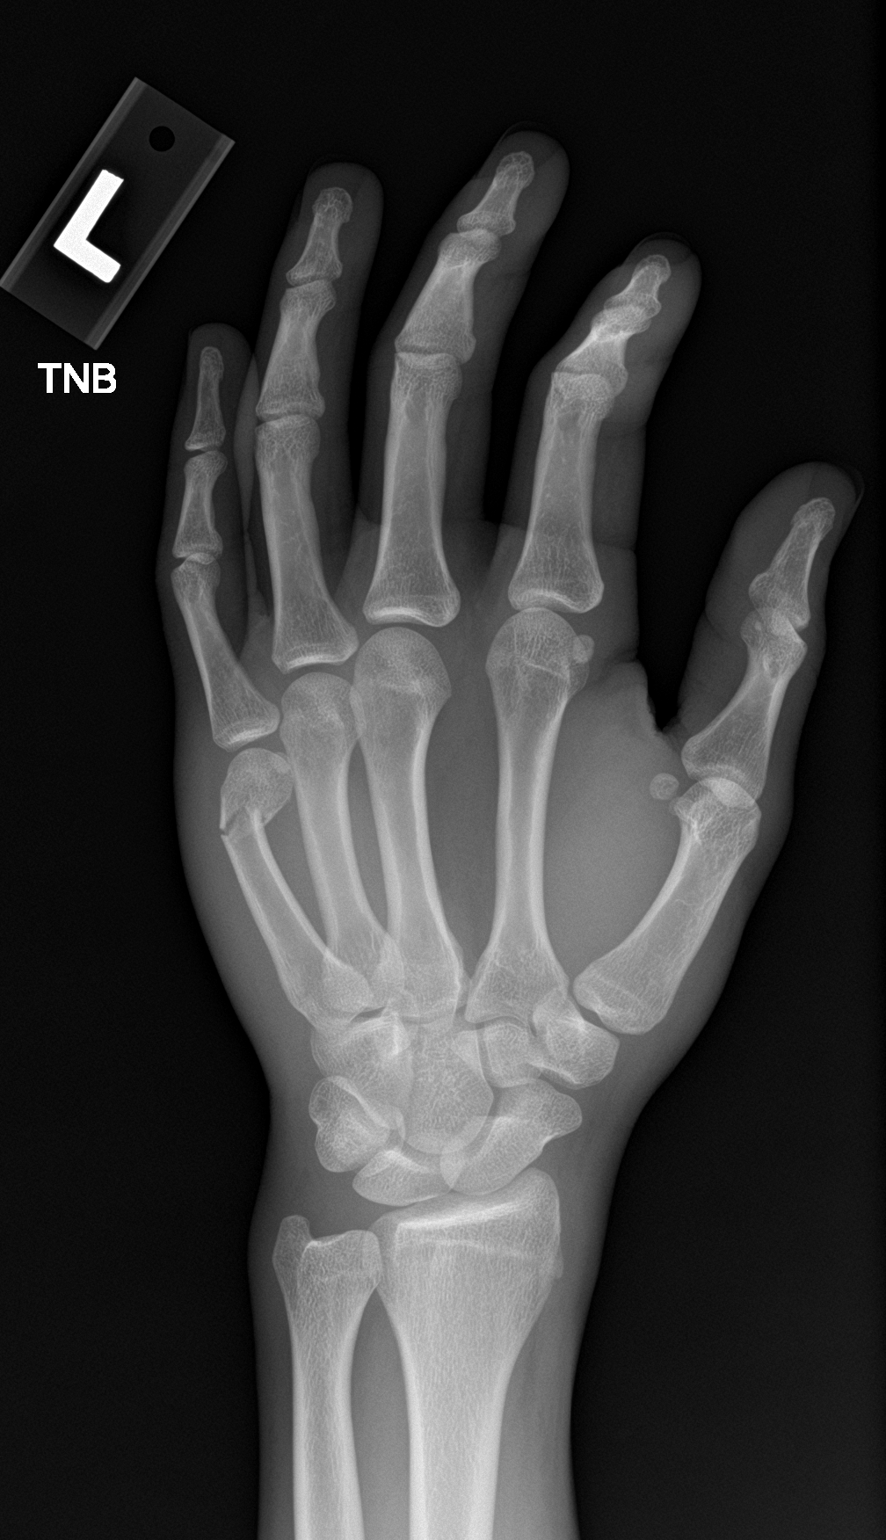

[hand lat]
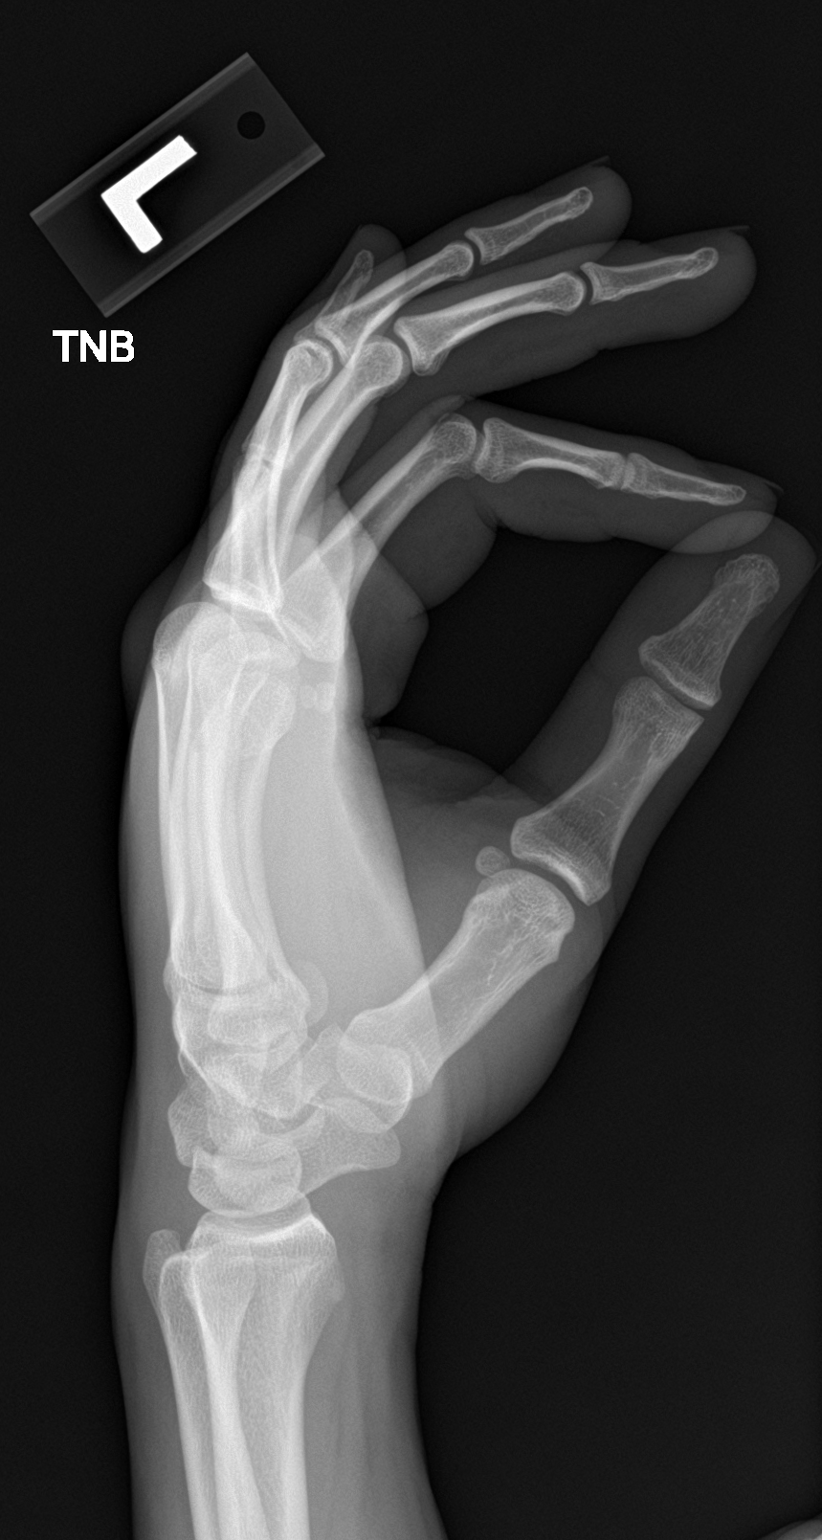

[3 of 3 positions shown; findings below may reference images not displayed]

FINDINGS: There is an acute displaced, angulated fracture through the head of
the fifth metacarpal. There is surrounding soft tissue swelling.
There is no frank dislocation.
IMPRESSION: Acute displaced, angulated fracture through the head of the fifth
metacarpal with surrounding soft tissue swelling.

## 2023-06-24 ENCOUNTER — Emergency Department (HOSPITAL_COMMUNITY)
Admission: EM | Admit: 2023-06-24 | Discharge: 2023-06-24 | Disposition: A | Discharge status: Home / Self Care | Payer: Self-pay | Attending: Emergency Medicine | Admitting: Emergency Medicine

## 2023-06-24 ENCOUNTER — Other Ambulatory Visit: Payer: Self-pay

## 2023-06-24 ENCOUNTER — Encounter (HOSPITAL_COMMUNITY): Payer: Self-pay

## 2023-06-24 DIAGNOSIS — K0889 Other specified disorders of teeth and supporting structures: Secondary | ICD-10-CM

## 2023-06-24 DIAGNOSIS — K029 Dental caries, unspecified: Secondary | ICD-10-CM | POA: Insufficient documentation

## 2023-06-24 MED ORDER — OXYCODONE-ACETAMINOPHEN 5-325 MG PO TABS: 1.0000 | ORAL_TABLET | Freq: Four times a day (QID) | ORAL | 0 refills | Status: AC | PRN

## 2023-06-24 MED ORDER — CLINDAMYCIN HCL 300 MG PO CAPS: 300.0000 mg | ORAL_CAPSULE | Freq: Two times a day (BID) | ORAL | 0 refills | Status: AC

## 2023-06-24 MED ORDER — CLINDAMYCIN HCL 300 MG PO CAPS
300.0000 mg | ORAL_CAPSULE | Freq: Once | ORAL | Status: AC
  Administered 2023-06-24: 300 mg via ORAL
  Filled 2023-06-24: qty 1

## 2023-06-24 MED ORDER — NAPROXEN 500 MG PO TABS: 500.0000 mg | ORAL_TABLET | Freq: Two times a day (BID) | ORAL | 0 refills | Status: AC

## 2023-06-24 NOTE — ED Triage Notes (Signed)
Pt states that he has a dental abscess x 3 days to his right upper tooth. Pt reports swelling and nerve pain in that tooth.

## 2023-06-24 NOTE — Discharge Instructions (Addendum)
Follow-up with your dentist  Return for new or worsening symptoms

## 2023-06-24 NOTE — ED Provider Notes (Signed)
[facility] EMERGENCY DEPARTMENT AT [facility] Provider Note   CSN: [id number] Arrival date & time: 06/24/23  2051    History  Chief Complaint  Patient presents with   Dental Problem    Jacob Hogan is a 27 y.o. male no past medical history here for evaluation of dental pain.  Pain to right upper dentition over the last 3 to 4 days.  Taking OTC medications without relief.  Some mild facial swelling, no redness, warmth.  Able to eat and drink at home.  No headache, fever, neck pain, neck stiffness, difficulty tolerating p.o. intake.  Has appointment for dentistry on Tuesday.  He has no dental caries and cracked teeth  HPI     Home Medications Prior to Admission medications   Medication Sig Start Date End Date Taking? Authorizing Provider  clindamycin (CLEOCIN) 300 MG capsule Take 1 capsule (300 mg total) by mouth 2 (two) times daily for 5 days. 06/24/23 06/29/23 Yes Jalaysia Lobb A, PA-C  naproxen (NAPROSYN) 500 MG tablet Take 1 tablet (500 mg total) by mouth 2 (two) times daily. 06/24/23  Yes Sallyann Kinnaird A, PA-C  oxyCODONE-acetaminophen (PERCOCET/ROXICET) 5-325 MG tablet Take 1 tablet by mouth every 6 (six) hours as needed for severe pain (pain score 7-10). 06/24/23  Yes Lucca Greggs A, PA-C      Allergies    Penicillins and Apple juice    Review of Systems   Review of Systems  Constitutional: Negative.   HENT:  Positive for dental problem and facial swelling. Negative for postnasal drip, rhinorrhea, sinus pressure, sinus pain, sneezing, sore throat, tinnitus, trouble swallowing and voice change.   Respiratory: Negative.  Negative for shortness of breath.   Cardiovascular: Negative.   Gastrointestinal:  Negative for nausea and vomiting.  Genitourinary: Negative.   Musculoskeletal:  Negative for neck pain and neck stiffness.  Skin: Negative.   Neurological: Negative.   All other systems reviewed and are negative.   Physical Exam Updated Vital  Signs BP 114/71 (BP Location: Left Arm)   Pulse 77   Temp 98.3 F (36.8 C) (Oral)   Resp 14   Ht 5\' 4"  (1.626 m)   Wt 61.2 kg   SpO2 100%   BMI 23.16 kg/m  Physical Exam Vitals and nursing note reviewed.  Constitutional:      General: He is not in acute distress.    Appearance: He is well-developed. He is not ill-appearing, toxic-appearing or diaphoretic.  HENT:     Head: Normocephalic and atraumatic. No laceration.     Jaw: There is normal jaw occlusion.      Comments: Minimal right maxillary swelling. No redness, warmth, fluctuance, induration.  No drooling, dysphagia, trismus.  No submandibular swelling.    Mouth/Throat:     Lips: Pink.     Mouth: Mucous membranes are moist.     Dentition: Abnormal dentition. Does not have dentures. Dental tenderness and dental caries present. No gingival swelling, dental abscesses or gum lesions.     Pharynx: Oropharynx is clear. Uvula midline.      Comments: Sublingual area soft, tongue midline, no pooling of secretions.  Gingival erythema and tenderness right upper dentition, multiple dental caries, cracked teeth Eyes:     Pupils: Pupils are equal, round, and reactive to light.  Neck:     Trachea: Trachea and phonation normal.     Comments: No neck stiffness or neck rigidity Cardiovascular:     Rate and Rhythm: Normal rate and regular rhythm.  Pulmonary:  Effort: Pulmonary effort is normal. No respiratory distress.  Abdominal:     General: There is no distension.     Palpations: Abdomen is soft.  Musculoskeletal:        General: Normal range of motion.     Cervical back: Full passive range of motion without pain, normal range of motion and neck supple.  Skin:    General: Skin is warm and dry.  Neurological:     General: No focal deficit present.     Mental Status: He is alert and oriented to person, place, and time.     ED Results / Procedures / Treatments   Labs (all labs ordered are listed, but only abnormal results are  displayed) Labs Reviewed - No data to display  EKG None  Radiology No results found.  Procedures Procedures    Medications Ordered in ED Medications  clindamycin (CLEOCIN) capsule 300 mg (300 mg Oral Given 06/24/23 2302)    ED Course/ Medical Decision Making/ A&P   27 year old here for evaluation of dental pain.  He is afebrile, nonseptic, not ill-appearing.  He is tolerating his secretions.  He has no voice changes, no drooling, dysphagia or trismus.  Sublingual area soft.  Multiple dental caries, gingival erythema and tenderness to right upper dentition.  Low suspicion for Ludwig's angina, deep space infection.  He is tolerating p.o. intake here.  Do not feel he needs labs or imaging.  He already has appointment for dentistry on Tuesday, 4 days from today will have him follow-up then, will start on antibiotics.  He is allergic to penicillins.  Clindamycin given here.  The patient has been appropriately medically screened and/or stabilized in the ED. I have low suspicion for any other emergent medical condition which would require further screening, evaluation or treatment in the ED or require inpatient management.  Patient is hemodynamically stable and in no acute distress.  Patient able to ambulate in department prior to ED.  Evaluation does not show acute pathology that would require ongoing or additional emergent interventions while in the emergency department or further inpatient treatment.  I have discussed the diagnosis with the patient and answered all questions.  Pain is been managed while in the emergency department and patient has no further complaints prior to discharge.  Patient is comfortable with plan discussed in room and is stable for discharge at this time.  I have discussed strict return precautions for returning to the emergency department.  Patient was encouraged to follow-up with PCP/specialist refer to at discharge.                                 Medical Decision  Making Amount and/or Complexity of Data Reviewed External Data Reviewed: labs, radiology and notes.  Risk OTC drugs. Prescription drug management. Decision regarding hospitalization. Diagnosis or treatment significantly limited by social determinants of health.          Final Clinical Impression(s) / ED Diagnoses Final diagnoses:  Pain, dental    Rx / DC Orders ED Discharge Orders          Ordered    clindamycin (CLEOCIN) 300 MG capsule  2 times daily        06/24/23 2222    oxyCODONE-acetaminophen (PERCOCET/ROXICET) 5-325 MG tablet  Every 6 hours PRN        06/24/23 2222    naproxen (NAPROSYN) 500 MG tablet  2 times daily  06/24/23 2222              Derrico Zhong A, PA-C 06/24/23 2310    Lorre Nick, MD 06/28/23 [phone]

## 2023-08-10 ENCOUNTER — Emergency Department (HOSPITAL_COMMUNITY)
Admission: EM | Admit: 2023-08-10 | Discharge: 2023-08-11 | Disposition: A | Discharge status: Home / Self Care | Payer: Self-pay | Attending: Emergency Medicine | Admitting: Emergency Medicine

## 2023-08-10 ENCOUNTER — Encounter (HOSPITAL_COMMUNITY): Payer: Self-pay

## 2023-08-10 ENCOUNTER — Other Ambulatory Visit: Payer: Self-pay

## 2023-08-10 DIAGNOSIS — K047 Periapical abscess without sinus: Secondary | ICD-10-CM | POA: Insufficient documentation

## 2023-08-10 NOTE — ED Triage Notes (Signed)
 Right upper dental pain that started last night, pt states he has had a recurrent abscess for 3-4 years in the same area. Swelling noted to face. Last dose of clindamycin 2 months ago.

## 2023-08-11 MED ORDER — NAPROXEN 500 MG PO TABS
500.0000 mg | ORAL_TABLET | Freq: Once | ORAL | Status: AC
  Administered 2023-08-11: 500 mg via ORAL
  Filled 2023-08-11: qty 1

## 2023-08-11 MED ORDER — CLINDAMYCIN HCL 150 MG PO CAPS: 150.0000 mg | ORAL_CAPSULE | Freq: Four times a day (QID) | ORAL | 0 refills | Status: AC

## 2023-08-11 NOTE — Discharge Instructions (Signed)
 You were seen today for ongoing concern for abdominal infection.  Take antibiotic as prescribed.  You may take Tylenol and ibuprofen for pain.  See dental resources provided.

## 2023-08-11 NOTE — ED Provider Notes (Signed)
 [place] [facility] DE[facility]MENT AT [facility] Provider Note   CSN: [id number] Arrival date & time: 08/10/23  2054     History  Chief Complaint  Patient presents with   Dental Problem    Jacob Hogan is a 28 y.o. male.  HPI     This is a 28 year old male who presents with recurrent dental infection.  Patient reports that he has had a recurrent dental infection and abscess in the right upper gumline.  He saw dentist and was told that he would need to spend over $3000 for dental work.  He states he does not have this money.  He last saw dentist approximately 2 months ago when he finished a course of clindamycin .  Over the last 24 hours he has had increased swelling and pain.  No fevers.  No difficulty swallowing.  Home Medications Prior to Admission medications   Medication Sig Start Date End Date Taking? Authorizing Provider  clindamycin  (CLEOCIN ) 150 MG capsule Take 1 capsule (150 mg total) by mouth every 6 (six) hours. 08/11/23  Yes Abdulai Blaylock, Charmaine FALCON, MD  naproxen  (NAPROSYN ) 500 MG tablet Take 1 tablet (500 mg total) by mouth 2 (two) times daily. 06/24/23   Henderly, Britni A, PA-C  oxyCODONE -acetaminophen  (PERCOCET/ROXICET) 5-325 MG tablet Take 1 tablet by mouth every 6 (six) hours as needed for severe pain (pain score 7-10). 06/24/23   Henderly, Britni A, PA-C      Allergies    Penicillins and Apple juice    Review of Systems   Review of Systems  Constitutional:  Negative for fever.  HENT:  Positive for dental problem. Negative for trouble swallowing.   All other systems reviewed and are negative.   Physical Exam Updated Vital Signs BP 105/63 (BP Location: Left Arm)   Pulse 71   Temp 98.5 F (36.9 C) (Oral)   Resp 14   Ht 1.626 m (5' 4)   Wt 59 kg   SpO2 98%   BMI 22.31 kg/m  Physical Exam Vitals and nursing note reviewed.  Constitutional:      Appearance: He is well-developed. He is not ill-appearing.  HENT:     Head: Normocephalic and  atraumatic.     Mouth/Throat:     Mouth: Mucous membranes are moist.     Comments: Swelling noted to right upper lip and cheek, there is tenderness to palpation along the gumline without obvious abscess, generally poor dentition noted, no trismus Eyes:     Pupils: Pupils are equal, round, and reactive to light.  Cardiovascular:     Rate and Rhythm: Normal rate and regular rhythm.  Pulmonary:     Effort: Pulmonary effort is normal. No respiratory distress.  Abdominal:     Palpations: Abdomen is soft.  Musculoskeletal:     Cervical back: Neck supple.  Lymphadenopathy:     Cervical: No cervical adenopathy.  Skin:    General: Skin is warm and dry.  Neurological:     Mental Status: He is alert and oriented to person, place, and time.  Psychiatric:        Mood and Affect: Mood normal.     ED Results / Procedures / Treatments   Labs (all labs ordered are listed, but only abnormal results are displayed) Labs Reviewed - No data to display  EKG None  Radiology No results found.  Procedures Procedures    Medications Ordered in ED Medications  naproxen  (NAPROSYN ) tablet 500 mg (has no administration in time range)  ED Course/ Medical Decision Making/ A&P                                 Medical Decision Making Risk Prescription drug management.   This patient presents to the ED for concern of dental pain this involves an extensive number of treatment options, and is a complaint that carries with it a high risk of complications and morbidity.  I considered the following differential and admission for this acute, potentially life threatening condition.  The differential diagnosis includes underlying infection, abscess, fracture  MDM:    This is a 28 year old male who presents with recurrent dental pain and swelling.  He is nontoxic and vital signs are reassuring.  No obvious drainable abscess on exam.  He has an amoxicillin  allergy.  Will treat with clindamycin .  He was  provided with dental resources.  Discussed with him that he will need to follow-up with dentistry as this is likely can require dental work.  (Labs, imaging, consults)  Labs: I Ordered, and personally interpreted labs.  The pertinent results include: None  Imaging Studies ordered: I ordered imaging studies including none I independently visualized and interpreted imaging. I agree with the radiologist interpretation  Additional history obtained from chart review.  External records from outside source obtained and reviewed including prior evaluations  Cardiac Monitoring: The patient was not maintained on a cardiac monitor.  If on the cardiac monitor, I personally viewed and interpreted the cardiac monitored which showed an underlying rhythm of: N/A   Reevaluation: After the interventions noted above, I reevaluated the patient and found that they have :stayed the same  Social Determinants of Health:  lives independently  Disposition: Discharge  Co morbidities that complicate the patient evaluation History reviewed. No pertinent past medical history.   Medicines Meds ordered this encounter  Medications   naproxen  (NAPROSYN ) tablet 500 mg   clindamycin  (CLEOCIN ) 150 MG capsule    Sig: Take 1 capsule (150 mg total) by mouth every 6 (six) hours.    Dispense:  40 capsule    Refill:  0    I have reviewed the patients home medicines and have made adjustments as needed  Problem List / ED Course: Problem List Items Addressed This Visit   None Visit Diagnoses       Dental infection    -  Primary   Relevant Medications   clindamycin  (CLEOCIN ) 150 MG capsule                   Final Clinical Impression(s) / ED Diagnoses Final diagnoses:  Dental infection    Rx / DC Orders ED Discharge Orders          Ordered    clindamycin  (CLEOCIN ) 150 MG capsule  Every 6 hours        08/11/23 0048              Bari Charmaine FALCON, MD 08/11/23 [phone]

## 2023-08-31 ENCOUNTER — Encounter (HOSPITAL_COMMUNITY): Payer: Self-pay

## 2023-08-31 ENCOUNTER — Other Ambulatory Visit: Payer: Self-pay

## 2023-08-31 ENCOUNTER — Emergency Department (HOSPITAL_COMMUNITY): Payer: Self-pay

## 2023-08-31 ENCOUNTER — Emergency Department (HOSPITAL_COMMUNITY)
Admission: EM | Admit: 2023-08-31 | Discharge: 2023-09-01 | Disposition: A | Discharge status: Home / Self Care | Payer: Self-pay | Attending: Emergency Medicine | Admitting: Emergency Medicine

## 2023-08-31 DIAGNOSIS — R55 Syncope and collapse: Secondary | ICD-10-CM | POA: Insufficient documentation

## 2023-08-31 DIAGNOSIS — Z5321 Procedure and treatment not carried out due to patient leaving prior to being seen by health care provider: Secondary | ICD-10-CM | POA: Insufficient documentation

## 2023-08-31 NOTE — ED Triage Notes (Signed)
C/o fall off skateboard with witness LOC for 10-15 seconds.  Busted lip and chipped tooth noted in triage.  Denies blood thinner usage.  Ambulatory to triage

## 2023-08-31 NOTE — ED Notes (Signed)
Pt to desk in lobby requesting ice for his face. Triage RN notified. Ice provided to pt.
